# Patient Record
Sex: Female | Born: 1955 | Race: White | Hispanic: No | Marital: Married | Smoking: Former smoker
Health system: Southern US, Community
[De-identification: ages and names within clinical notes are randomized; demographics above are authoritative.]

## PROBLEM LIST (undated history)

## (undated) DIAGNOSIS — F419 Anxiety disorder, unspecified: Secondary | ICD-10-CM

## (undated) DIAGNOSIS — T7840XA Allergy, unspecified, initial encounter: Secondary | ICD-10-CM

## (undated) DIAGNOSIS — E785 Hyperlipidemia, unspecified: Secondary | ICD-10-CM

## (undated) DIAGNOSIS — K219 Gastro-esophageal reflux disease without esophagitis: Secondary | ICD-10-CM

## (undated) DIAGNOSIS — I341 Nonrheumatic mitral (valve) prolapse: Secondary | ICD-10-CM

## (undated) DIAGNOSIS — I10 Essential (primary) hypertension: Secondary | ICD-10-CM

## (undated) DIAGNOSIS — E119 Type 2 diabetes mellitus without complications: Secondary | ICD-10-CM

## (undated) HISTORY — DX: Nonrheumatic mitral (valve) prolapse: I34.1

## (undated) HISTORY — DX: Essential (primary) hypertension: I10

## (undated) HISTORY — DX: Gastro-esophageal reflux disease without esophagitis: K21.9

## (undated) HISTORY — DX: Allergy, unspecified, initial encounter: T78.40XA

## (undated) HISTORY — DX: Anxiety disorder, unspecified: F41.9

## (undated) HISTORY — DX: Hyperlipidemia, unspecified: E78.5

## (undated) HISTORY — DX: Type 2 diabetes mellitus without complications: E11.9

---
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
## 1982-11-09 HISTORY — PX: CHOLECYSTECTOMY: SHX55

## 1988-11-09 DIAGNOSIS — I10 Essential (primary) hypertension: Secondary | ICD-10-CM

## 1988-11-09 HISTORY — DX: Essential (primary) hypertension: I10

## 1990-11-09 HISTORY — PX: OTHER SURGICAL HISTORY: SHX169

## 1999-11-10 HISTORY — PX: OTHER SURGICAL HISTORY: SHX169

## 2000-06-30 ENCOUNTER — Other Ambulatory Visit: Admission: RE | Admit: 2000-06-30 | Discharge: 2000-06-30 | Payer: Self-pay | Admitting: Emergency Medicine

## 2001-04-12 ENCOUNTER — Encounter: Payer: Self-pay | Admitting: Emergency Medicine

## 2001-04-12 ENCOUNTER — Encounter: Admission: RE | Admit: 2001-04-12 | Discharge: 2001-04-12 | Payer: Self-pay | Admitting: Emergency Medicine

## 2001-05-09 ENCOUNTER — Encounter: Admission: RE | Admit: 2001-05-09 | Discharge: 2001-05-09 | Payer: Self-pay | Admitting: Emergency Medicine

## 2001-05-09 ENCOUNTER — Encounter: Payer: Self-pay | Admitting: Emergency Medicine

## 2001-08-11 ENCOUNTER — Encounter: Admission: RE | Admit: 2001-08-11 | Discharge: 2001-08-11 | Payer: Self-pay | Admitting: Emergency Medicine

## 2001-08-11 ENCOUNTER — Encounter: Payer: Self-pay | Admitting: Emergency Medicine

## 2001-11-09 HISTORY — PX: INCISIONAL HERNIA REPAIR: SHX193

## 2004-01-30 ENCOUNTER — Ambulatory Visit (HOSPITAL_COMMUNITY): Admission: RE | Admit: 2004-01-30 | Discharge: 2004-01-30 | Payer: Self-pay | Admitting: Emergency Medicine

## 2004-01-30 ENCOUNTER — Encounter: Admission: RE | Admit: 2004-01-30 | Discharge: 2004-01-30 | Payer: Self-pay | Admitting: Emergency Medicine

## 2005-03-03 ENCOUNTER — Encounter: Admission: RE | Admit: 2005-03-03 | Discharge: 2005-03-03 | Payer: Self-pay | Admitting: Emergency Medicine

## 2007-01-27 ENCOUNTER — Encounter: Admission: RE | Admit: 2007-01-27 | Discharge: 2007-01-27 | Payer: Self-pay | Admitting: Emergency Medicine

## 2007-03-11 ENCOUNTER — Encounter: Admission: RE | Admit: 2007-03-11 | Discharge: 2007-03-11 | Payer: Self-pay | Admitting: Emergency Medicine

## 2008-02-20 ENCOUNTER — Encounter: Admission: RE | Admit: 2008-02-20 | Discharge: 2008-05-20 | Payer: Self-pay | Admitting: Emergency Medicine

## 2008-04-04 ENCOUNTER — Encounter: Admission: RE | Admit: 2008-04-04 | Discharge: 2008-04-04 | Payer: Self-pay | Admitting: Emergency Medicine

## 2011-03-09 ENCOUNTER — Ambulatory Visit
Admission: RE | Admit: 2011-03-09 | Discharge: 2011-03-09 | Disposition: A | Discharge status: Home / Self Care | Payer: PRIVATE HEALTH INSURANCE | Source: Ambulatory Visit | Attending: Emergency Medicine | Admitting: Emergency Medicine

## 2011-03-09 ENCOUNTER — Other Ambulatory Visit: Payer: Self-pay | Admitting: Emergency Medicine

## 2011-03-09 DIAGNOSIS — M25561 Pain in right knee: Secondary | ICD-10-CM

## 2011-06-19 ENCOUNTER — Other Ambulatory Visit: Payer: Self-pay | Admitting: Emergency Medicine

## 2011-06-19 DIAGNOSIS — Z1231 Encounter for screening mammogram for malignant neoplasm of breast: Secondary | ICD-10-CM

## 2011-07-23 ENCOUNTER — Ambulatory Visit
Admission: RE | Admit: 2011-07-23 | Discharge: 2011-07-23 | Disposition: A | Discharge status: Home / Self Care | Payer: PRIVATE HEALTH INSURANCE | Source: Ambulatory Visit | Attending: Emergency Medicine | Admitting: Emergency Medicine

## 2011-07-23 DIAGNOSIS — Z1231 Encounter for screening mammogram for malignant neoplasm of breast: Secondary | ICD-10-CM

## 2011-11-10 DIAGNOSIS — E785 Hyperlipidemia, unspecified: Secondary | ICD-10-CM

## 2011-11-10 HISTORY — DX: Hyperlipidemia, unspecified: E78.5

## 2013-08-18 ENCOUNTER — Other Ambulatory Visit: Payer: Self-pay | Admitting: Family Medicine

## 2013-08-18 DIAGNOSIS — Z1231 Encounter for screening mammogram for malignant neoplasm of breast: Secondary | ICD-10-CM

## 2013-08-18 DIAGNOSIS — E2839 Other primary ovarian failure: Secondary | ICD-10-CM

## 2013-09-05 ENCOUNTER — Encounter: Payer: Self-pay | Admitting: Internal Medicine

## 2013-09-19 ENCOUNTER — Ambulatory Visit: Payer: PRIVATE HEALTH INSURANCE | Admitting: Internal Medicine

## 2013-11-09 DIAGNOSIS — F419 Anxiety disorder, unspecified: Secondary | ICD-10-CM

## 2013-11-09 DIAGNOSIS — K219 Gastro-esophageal reflux disease without esophagitis: Secondary | ICD-10-CM

## 2013-11-09 HISTORY — DX: Anxiety disorder, unspecified: F41.9

## 2013-11-09 HISTORY — DX: Gastro-esophageal reflux disease without esophagitis: K21.9

## 2013-11-16 ENCOUNTER — Encounter: Payer: PRIVATE HEALTH INSURANCE | Admitting: Internal Medicine

## 2013-11-20 ENCOUNTER — Ambulatory Visit
Admission: RE | Admit: 2013-11-20 | Discharge: 2013-11-20 | Disposition: A | Discharge status: Home / Self Care | Payer: PRIVATE HEALTH INSURANCE | Source: Ambulatory Visit | Attending: Family Medicine | Admitting: Family Medicine

## 2013-11-20 DIAGNOSIS — E2839 Other primary ovarian failure: Secondary | ICD-10-CM

## 2013-11-20 DIAGNOSIS — Z1231 Encounter for screening mammogram for malignant neoplasm of breast: Secondary | ICD-10-CM

## 2013-11-23 ENCOUNTER — Encounter: Payer: Self-pay | Admitting: Family Medicine

## 2015-01-21 IMAGING — MG MM SCREEN MAMMOGRAM BILATERAL
4 series · 4 of 4 positions shown · non-contrast
Comparison: 07/23/2011, 01/27/2007

CLINICAL DATA: Screening.

EXAM:
DIGITAL SCREENING BILATERAL MAMMOGRAM WITH CAD

[R CC]
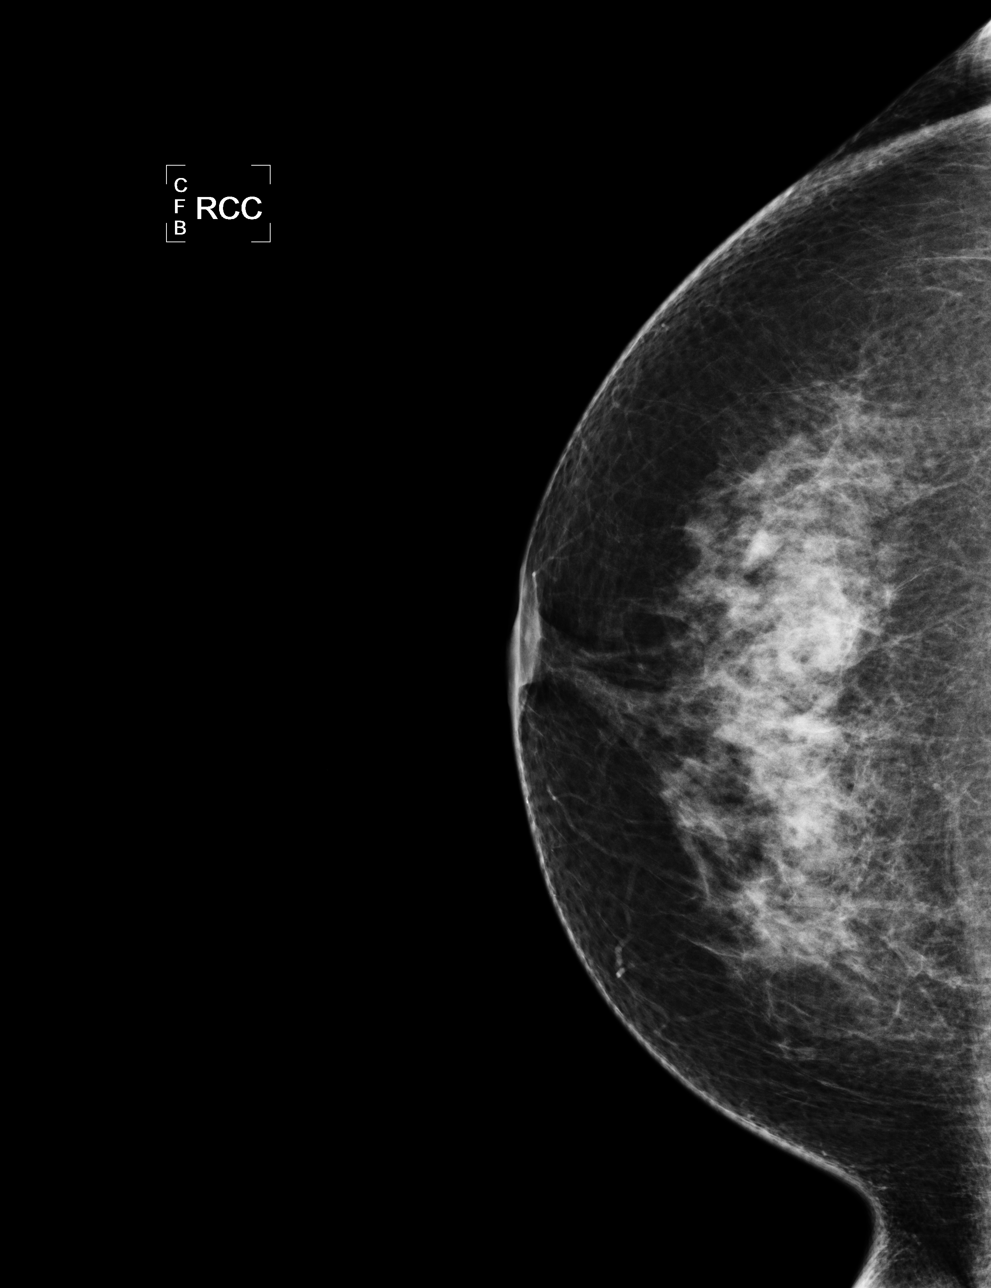

[L CC]
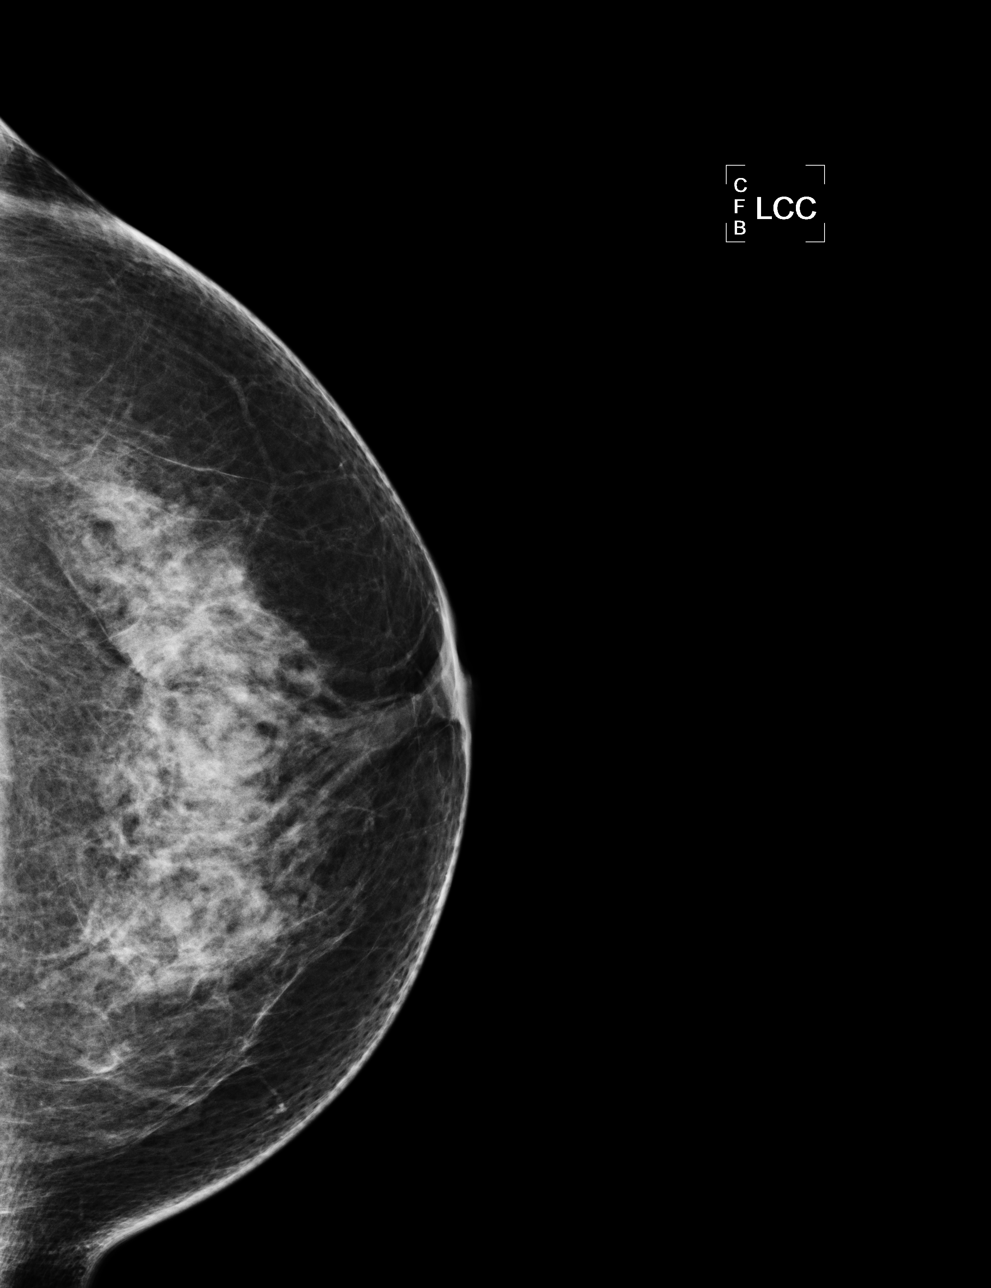

[L MLO]
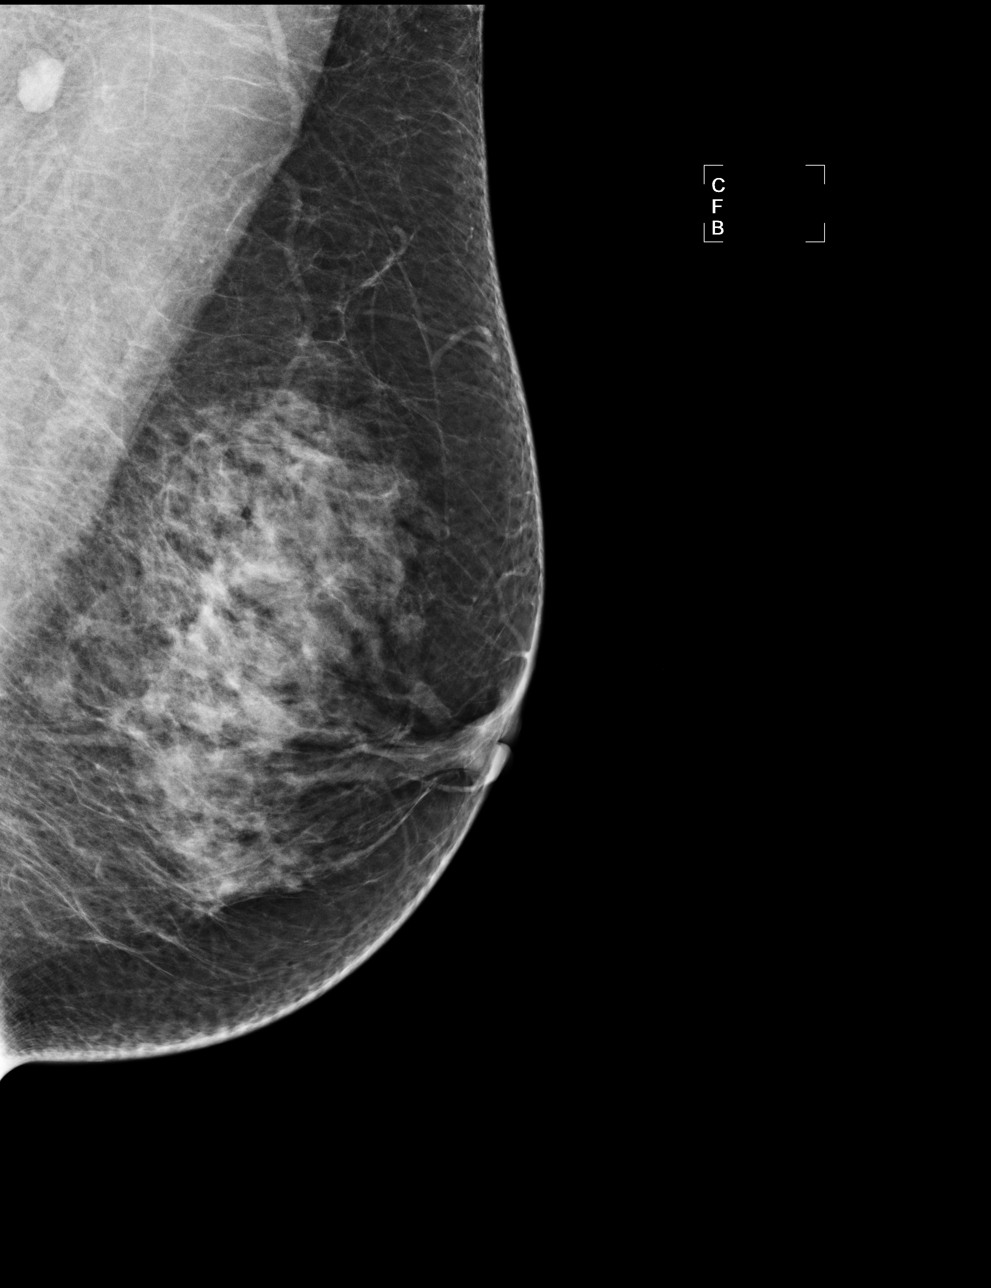

[R MLO]
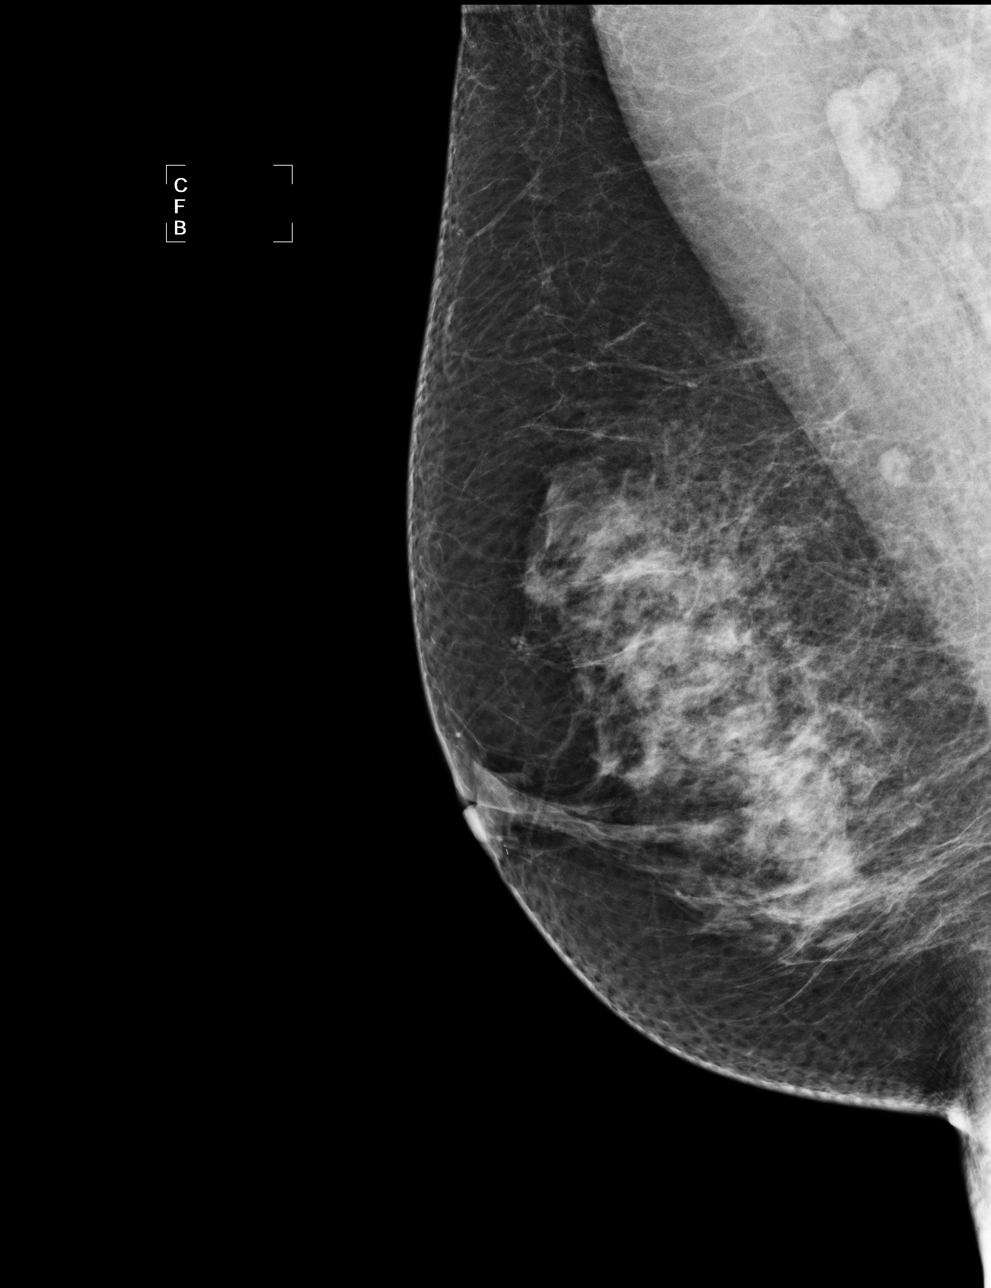

[4 of 4 positions shown; findings below may reference images not displayed]

ACR Breast Density Category c: The breast tissue is heterogeneously
dense, which may obscure small masses.
FINDINGS: There are no findings suspicious for malignancy. Images were
processed with CAD.
IMPRESSION: No mammographic evidence of malignancy. A result letter of this
screening mammogram will be mailed directly to the patient.

RECOMMENDATION:
Screening mammogram in one year. (Code:3R-3-UMG)

BI-RADS CATEGORY  1: Negative

## 2015-03-05 ENCOUNTER — Other Ambulatory Visit: Payer: Self-pay | Admitting: Family Medicine

## 2015-03-05 ENCOUNTER — Ambulatory Visit
Admission: RE | Admit: 2015-03-05 | Discharge: 2015-03-05 | Disposition: A | Discharge status: Home / Self Care | Payer: 59 | Source: Ambulatory Visit | Attending: Family Medicine | Admitting: Family Medicine

## 2015-03-05 DIAGNOSIS — M545 Low back pain: Secondary | ICD-10-CM

## 2015-03-06 ENCOUNTER — Other Ambulatory Visit: Payer: Self-pay | Admitting: Family Medicine

## 2015-03-06 DIAGNOSIS — R109 Unspecified abdominal pain: Secondary | ICD-10-CM

## 2015-03-08 ENCOUNTER — Ambulatory Visit
Admission: RE | Admit: 2015-03-08 | Discharge: 2015-03-08 | Disposition: A | Discharge status: Home / Self Care | Payer: 59 | Source: Ambulatory Visit | Attending: Family Medicine | Admitting: Family Medicine

## 2015-03-08 DIAGNOSIS — R109 Unspecified abdominal pain: Secondary | ICD-10-CM

## 2016-05-08 IMAGING — US US RENAL
1 series · 14 of 25 positions shown · non-contrast
Comparison: Abdominal CT 03/11/2007

CLINICAL DATA: Left flank pain

EXAM:
RENAL / URINARY TRACT ULTRASOUND COMPLETE

[Series 1: us renal · 0.30mm/px · 14 of 45 slices shown]
[im 1/45]
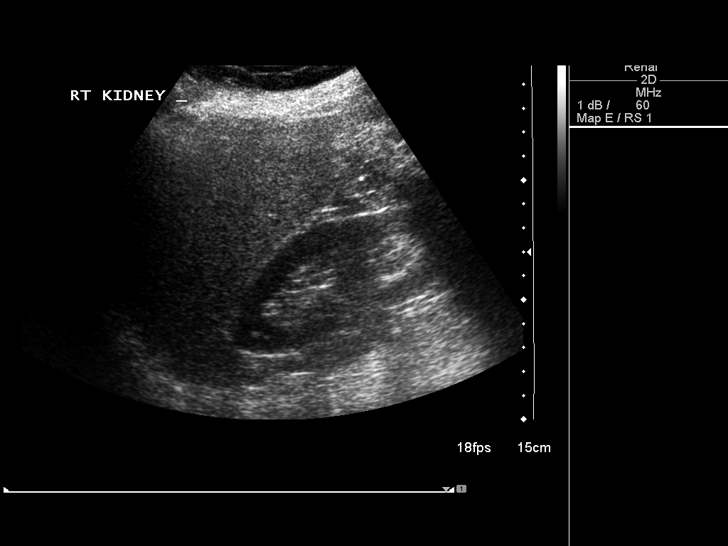
[im 4/45]
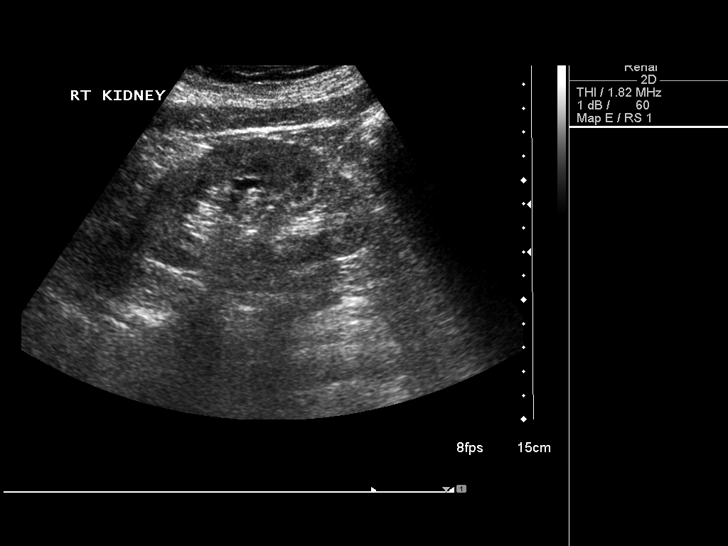
[im 8/45]
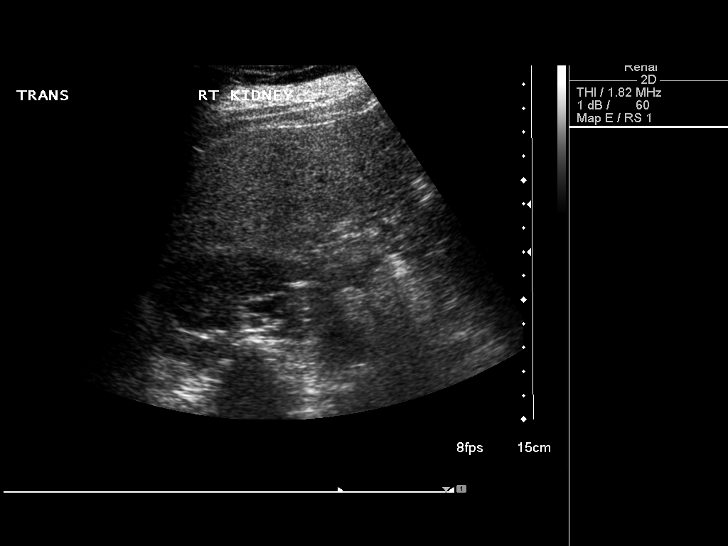
[im 12/45]
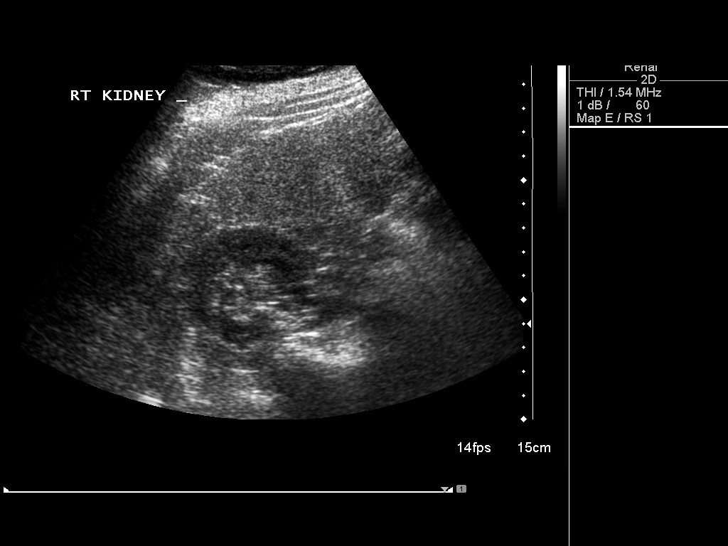
[im 15/45]
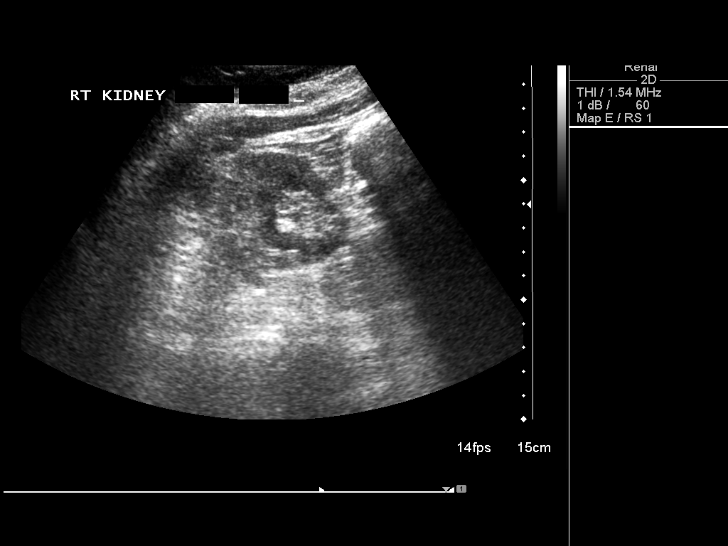
[im 17/45]
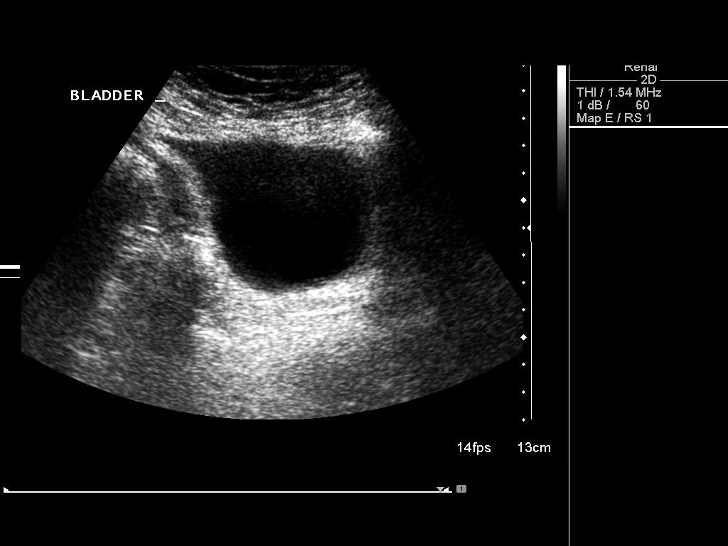
[im 21/45]
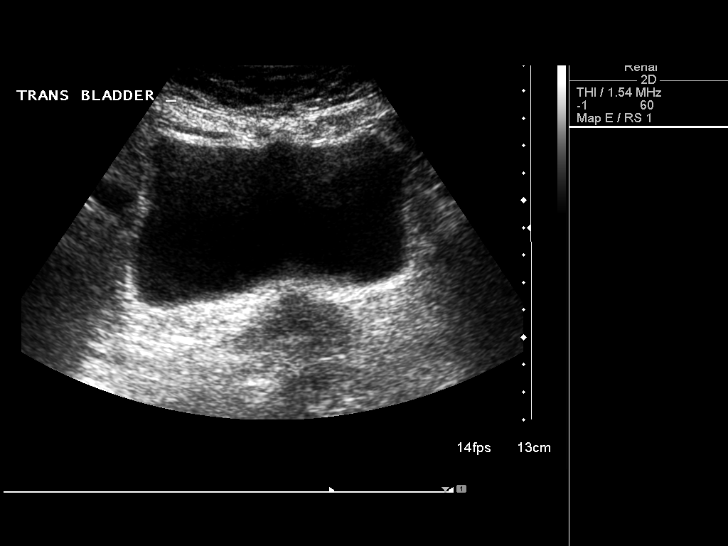
[im 24/45]
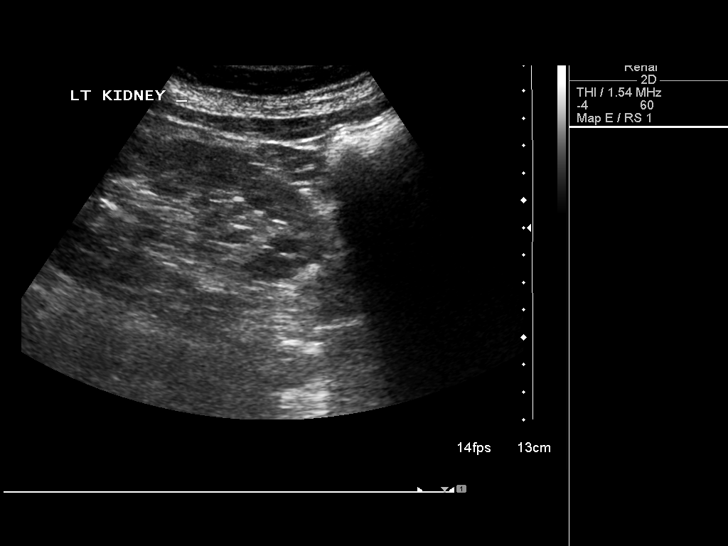
[im 28/45]
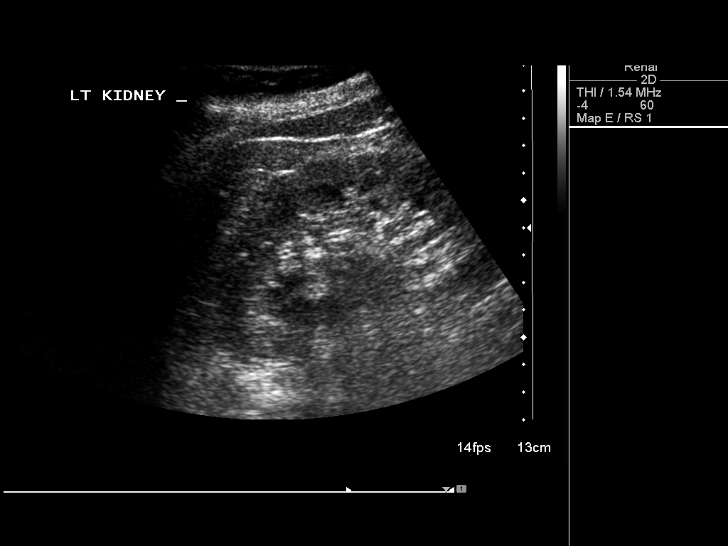
[im 30/45]
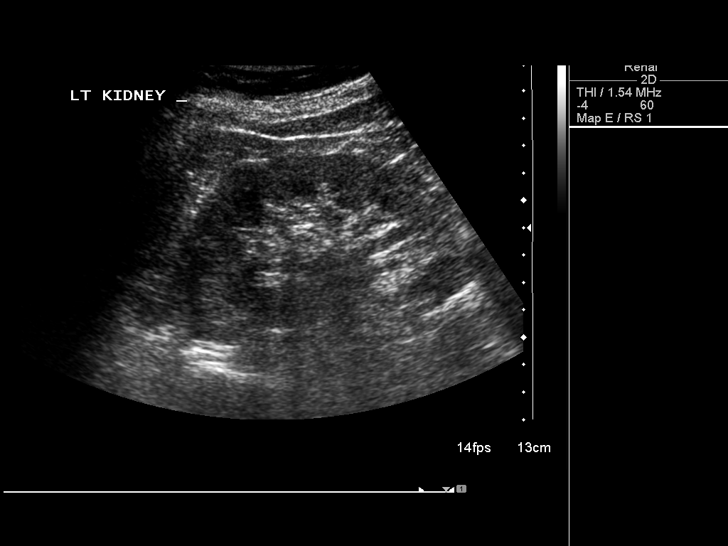
[im 34/45]
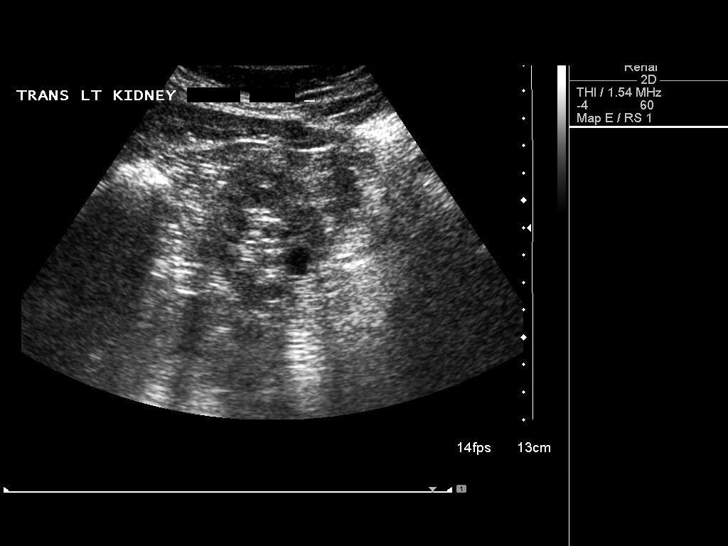
[im 37/45]
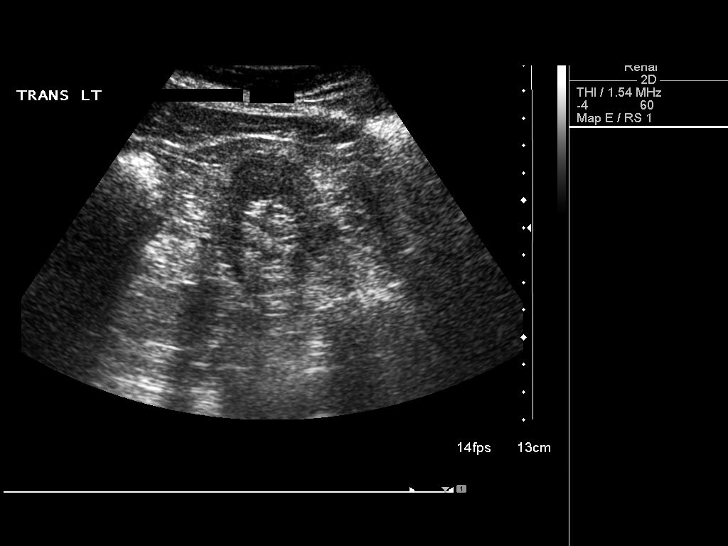
[im 41/45]
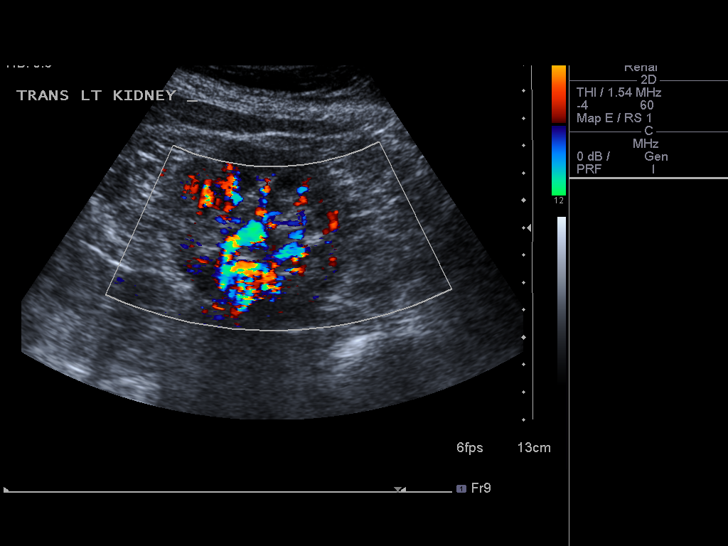
[im 45/45]
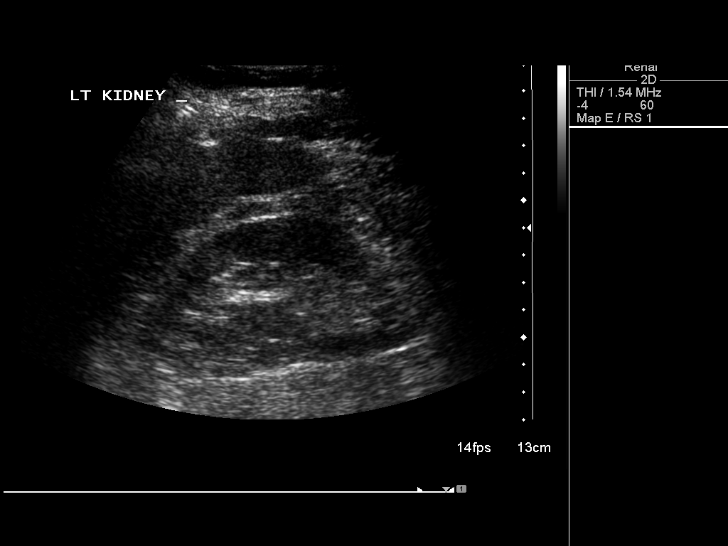

[14 of 25 positions shown; findings below may reference images not displayed]

FINDINGS: Right Kidney:

Length: 11 cm. Echogenicity within normal limits. No mass or
hydronephrosis visualized.

Left Kidney:

Length: 11 cm. Echogenicity within normal limits. No solid mass or
hydronephrosis visualized. Incidental 11 mm cyst in the lower
cortex, also seen in 1887.

Bladder:

Appears normal for degree of bladder distention.
IMPRESSION: No hydronephrosis or other explanation for flank pain.

## 2016-11-04 ENCOUNTER — Other Ambulatory Visit: Payer: Self-pay | Admitting: Family Medicine

## 2016-11-04 DIAGNOSIS — M8580 Other specified disorders of bone density and structure, unspecified site: Secondary | ICD-10-CM

## 2016-11-04 DIAGNOSIS — Z780 Asymptomatic menopausal state: Secondary | ICD-10-CM

## 2016-11-11 ENCOUNTER — Encounter: Payer: Self-pay | Admitting: Internal Medicine

## 2016-12-01 ENCOUNTER — Ambulatory Visit
Admission: RE | Admit: 2016-12-01 | Discharge: 2016-12-01 | Disposition: A | Discharge status: Home / Self Care | Payer: BLUE CROSS/BLUE SHIELD | Source: Ambulatory Visit | Attending: Family Medicine | Admitting: Family Medicine

## 2016-12-01 DIAGNOSIS — M8580 Other specified disorders of bone density and structure, unspecified site: Secondary | ICD-10-CM

## 2016-12-01 DIAGNOSIS — Z780 Asymptomatic menopausal state: Secondary | ICD-10-CM

## 2016-12-30 ENCOUNTER — Ambulatory Visit: Payer: BLUE CROSS/BLUE SHIELD

## 2016-12-30 VITALS — Ht 63.0 in | Wt 171.0 lb

## 2016-12-30 DIAGNOSIS — Z1211 Encounter for screening for malignant neoplasm of colon: Secondary | ICD-10-CM

## 2016-12-30 MED ORDER — NA SULFATE-K SULFATE-MG SULF 17.5-3.13-1.6 GM/177ML PO SOLN: 1.0000 | Freq: Once | ORAL | 0 refills | Status: AC

## 2017-01-01 ENCOUNTER — Encounter: Payer: Self-pay | Admitting: Internal Medicine

## 2017-01-13 ENCOUNTER — Encounter: Payer: Self-pay | Admitting: Internal Medicine

## 2017-01-15 ENCOUNTER — Ambulatory Visit (AMBULATORY_SURGERY_CENTER): Payer: BLUE CROSS/BLUE SHIELD | Admitting: Internal Medicine

## 2017-01-15 ENCOUNTER — Encounter: Payer: Self-pay | Admitting: Internal Medicine

## 2017-01-15 VITALS — BP 135/71 | HR 63 | Temp 99.6°F | Resp 13 | Ht 63.0 in | Wt 171.0 lb

## 2017-01-15 DIAGNOSIS — K635 Polyp of colon: Secondary | ICD-10-CM

## 2017-01-15 DIAGNOSIS — Z1211 Encounter for screening for malignant neoplasm of colon: Secondary | ICD-10-CM | POA: Diagnosis present

## 2017-01-15 DIAGNOSIS — D125 Benign neoplasm of sigmoid colon: Secondary | ICD-10-CM

## 2017-01-15 DIAGNOSIS — D123 Benign neoplasm of transverse colon: Secondary | ICD-10-CM

## 2017-01-15 DIAGNOSIS — Z1212 Encounter for screening for malignant neoplasm of rectum: Secondary | ICD-10-CM | POA: Diagnosis not present

## 2017-01-15 MED ORDER — SODIUM CHLORIDE 0.9 % IV SOLN: 500.0000 mL | INTRAVENOUS | Status: DC

## 2017-01-15 NOTE — Progress Notes (Signed)
Called to room to assist during endoscopic procedure.  Patient ID and intended procedure confirmed with present staff. Received instructions for my participation in the procedure from the performing physician.  

## 2017-01-15 NOTE — Progress Notes (Signed)
To PACU, vss patent aw report to rn 

## 2017-01-15 NOTE — Patient Instructions (Signed)
YOU HAD AN ENDOSCOPIC PROCEDURE TODAY AT [facility]:   Refer to the procedure report that was given to you for any specific questions about what was found during the examination.  If the procedure report does not answer your questions, please call your gastroenterologist to clarify.  If you requested that your care partner not be given the details of your procedure findings, then the procedure report has been included in a sealed envelope for you to review at your convenience later.  YOU SHOULD EXPECT: Some feelings of bloating in the abdomen. Passage of more gas than usual.  Walking can help get rid of the air that was put into your GI tract during the procedure and reduce the bloating. If you had a lower endoscopy (such as a colonoscopy or flexible sigmoidoscopy) you may notice spotting of blood in your stool or on the toilet paper. If you underwent a bowel prep for your procedure, you may not have a normal bowel movement for a few days.  Please Note:  You might notice some irritation and congestion in your nose or some drainage.  This is from the oxygen used during your procedure.  There is no need for concern and it should clear up in a day or so.  SYMPTOMS TO REPORT IMMEDIATELY:   Following lower endoscopy (colonoscopy or flexible sigmoidoscopy):  Excessive amounts of blood in the stool  Significant tenderness or worsening of abdominal pains  Swelling of the abdomen that is new, acute  Fever of 100F or higher   For urgent or emergent issues, a gastroenterologist can be reached at any hour by calling [phone].   DIET:  We do recommend a small meal at first, but then you may proceed to your regular diet.  Drink plenty of fluids but you should avoid alcoholic beverages for 24 hours.  ACTIVITY:  You should plan to take it easy for the rest of today and you should NOT DRIVE or use heavy machinery until tomorrow (because of the sedation medicines used during the test).     FOLLOW UP: Our staff will call the number listed on your records the next business day following your procedure to check on you and address any questions or concerns that you may have regarding the information given to you following your procedure. If we do not reach you, we will leave a message.  However, if you are feeling well and you are not experiencing any problems, there is no need to return our call.  We will assume that you have returned to your regular daily activities without incident.  If any biopsies were taken you will be contacted by phone or by letter within the next 1-3 weeks.  Please call us at [phone] if you have not heard about the biopsies in 3 weeks.   Await for biopsy results to determined next repeat Colonoscopy screening in probably 3- 5 years Diverticulosis (handout given)   SIGNATURES/CONFIDENTIALITY: You and/or your care partner have signed paperwork which will be entered into your electronic medical record.  These signatures attest to the fact that that the information above on your After Visit Summary has been reviewed and is understood.  Full responsibility of the confidentiality of this discharge information lies with you and/or your care-partner.

## 2017-01-15 NOTE — Op Note (Signed)
[facility] Patient Name: Stacy Parrish Procedure Date: 01/15/2017 11:15 AM MRN: [id number] Endoscopist: Docia Chuck. Henrene Pastor , MD Age: 61 Referring MD:  Date of Birth: May 18, 1956 Gender: Female Account #: [id number] Procedure:                Colonoscopy, with cold snare polypectomy x 4 Indications:              Screening for colorectal malignant neoplasm Medicines:                Monitored Anesthesia Care Procedure:                Pre-Anesthesia Assessment:                           - Prior to the procedure, a History and Physical                            was performed, and patient medications and                            allergies were reviewed. The patient's tolerance of                            previous anesthesia was also reviewed. The risks                            and benefits of the procedure and the sedation                            options and risks were discussed with the patient.                            All questions were answered, and informed consent                            was obtained. Prior Anticoagulants: The patient has                            taken no previous anticoagulant or antiplatelet                            agents. ASA Grade Assessment: II - A patient with                            mild systemic disease. After reviewing the risks                            and benefits, the patient was deemed in                            satisfactory condition to undergo the procedure.                           After obtaining informed consent, the colonoscope  was passed under direct vision. Throughout the                            procedure, the patient's blood pressure, pulse, and                            oxygen saturations were monitored continuously. The                            Colonoscope was introduced through the anus and                            advanced to the the cecum, identified by         appendiceal orifice and ileocecal valve. The                            ileocecal valve, appendiceal orifice, and rectum                            were photographed. The quality of the bowel                            preparation was excellent. The colonoscopy was                            performed without difficulty. The patient tolerated                            the procedure well. The bowel preparation used was                            SUPREP. Scope In: 11:21:28 AM Scope Out: 11:41:57 AM Scope Withdrawal Time: 0 hours 15 minutes 33 seconds  Total Procedure Duration: 0 hours 20 minutes 29 seconds  Findings:                 Four polyps were found in the sigmoid colon and                            transverse colon. The polyps were 3 to 6 mm in                            size. These polyps were removed with a cold snare.                            Resection and retrieval were complete.                           Multiple diverticula were found in the sigmoid                            colon.                           The exam  was otherwise without abnormality on                            direct and retroflexion views. Complications:            No immediate complications. Estimated blood loss:                            None. Estimated Blood Loss:     Estimated blood loss: none. Impression:               - Four 3 to 6 mm polyps in the sigmoid colon and in                            the transverse colon, removed with a cold snare.                            Resected and retrieved.                           - Diverticulosis in the sigmoid colon.                           - The examination was otherwise normal on direct                            and retroflexion views. Recommendation:           - Repeat colonoscopy in 3 - 5 years for                            surveillance. final recndation based on pathology                            results.                           -  Patient has a contact number available for                            emergencies. The signs and symptoms of potential                            delayed complications were discussed with the                            patient. Return to normal activities tomorrow.                            Written discharge instructions were provided to the                            patient.                           - Resume previous diet.                           -  Continue present medications.                           - Await pathology results. Docia Chuck. Henrene Pastor, MD 01/15/2017 11:48:35 AM This report has been signed electronically.

## 2017-01-19 ENCOUNTER — Encounter: Payer: Self-pay | Admitting: Internal Medicine

## 2018-01-07 ENCOUNTER — Other Ambulatory Visit: Payer: Self-pay | Admitting: Family Medicine

## 2018-01-07 DIAGNOSIS — Z1231 Encounter for screening mammogram for malignant neoplasm of breast: Secondary | ICD-10-CM

## 2018-01-26 ENCOUNTER — Ambulatory Visit
Admission: RE | Admit: 2018-01-26 | Discharge: 2018-01-26 | Disposition: A | Discharge status: Home / Self Care | Payer: BLUE CROSS/BLUE SHIELD | Source: Ambulatory Visit | Attending: Family Medicine | Admitting: Family Medicine

## 2018-01-26 DIAGNOSIS — Z1231 Encounter for screening mammogram for malignant neoplasm of breast: Secondary | ICD-10-CM

## 2019-03-29 IMAGING — MG DIGITAL SCREENING BILATERAL MAMMOGRAM WITH CAD
4 series · 4 of 4 positions shown · non-contrast
Comparison: Previous exam(s).

CLINICAL DATA: Screening.

EXAM:
DIGITAL SCREENING BILATERAL MAMMOGRAM WITH CAD

[R CC]
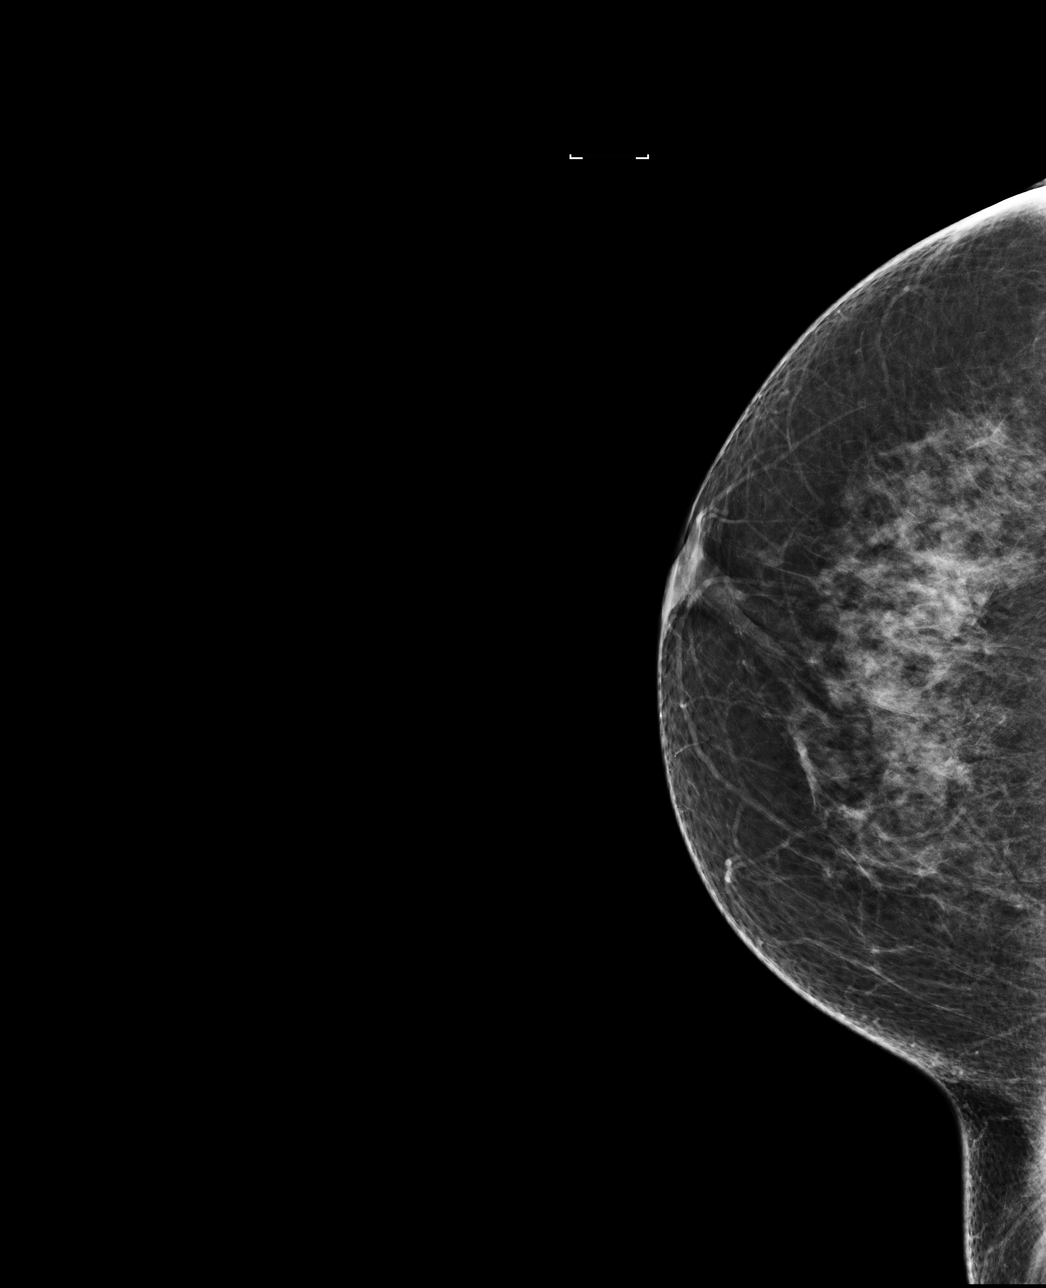

[R MLO]
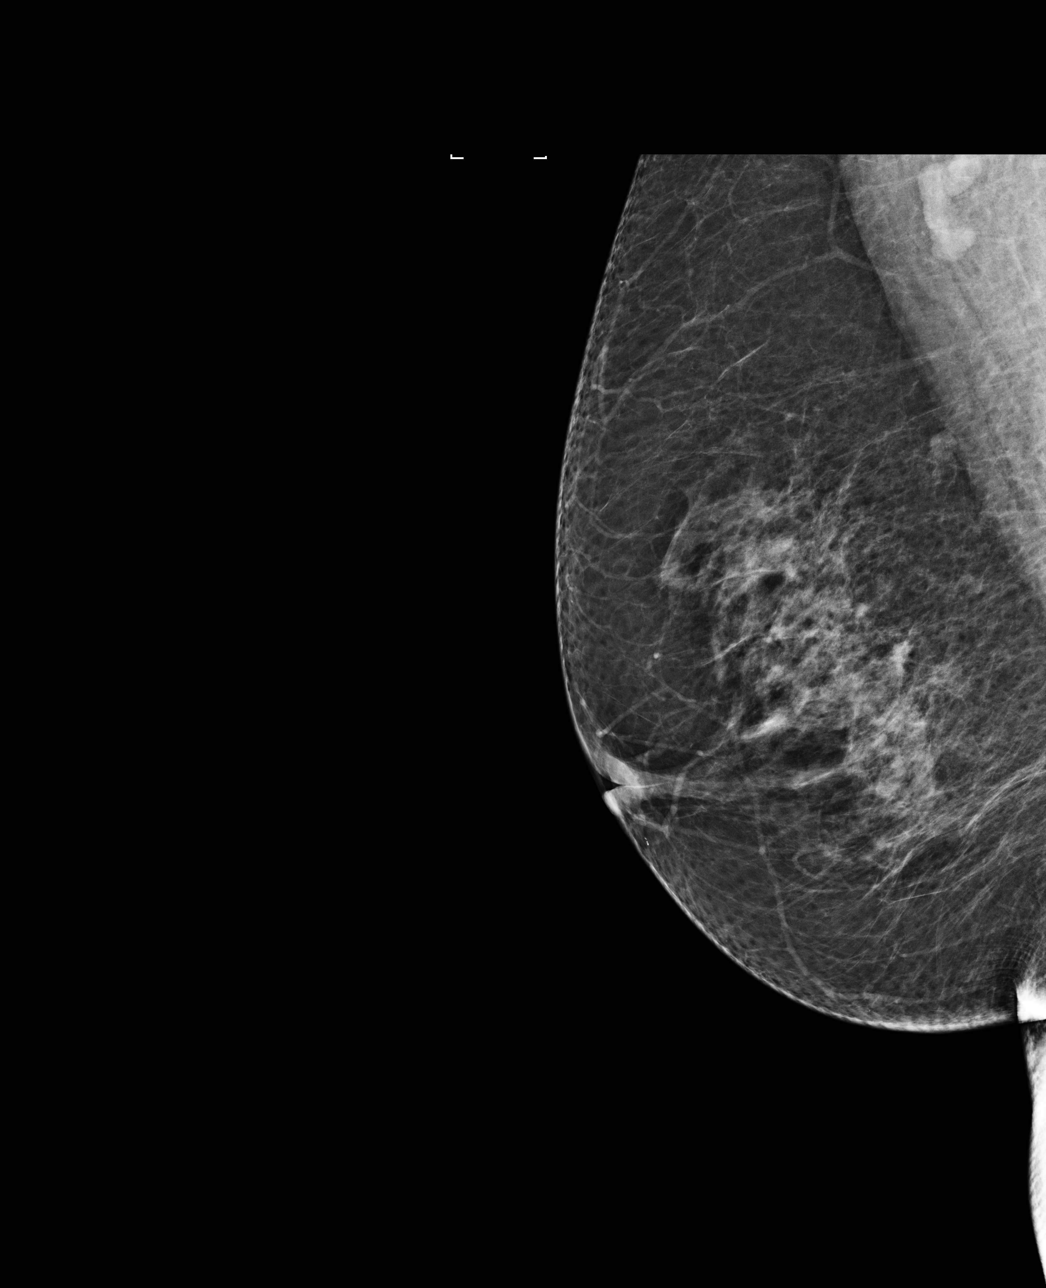

[L CC]
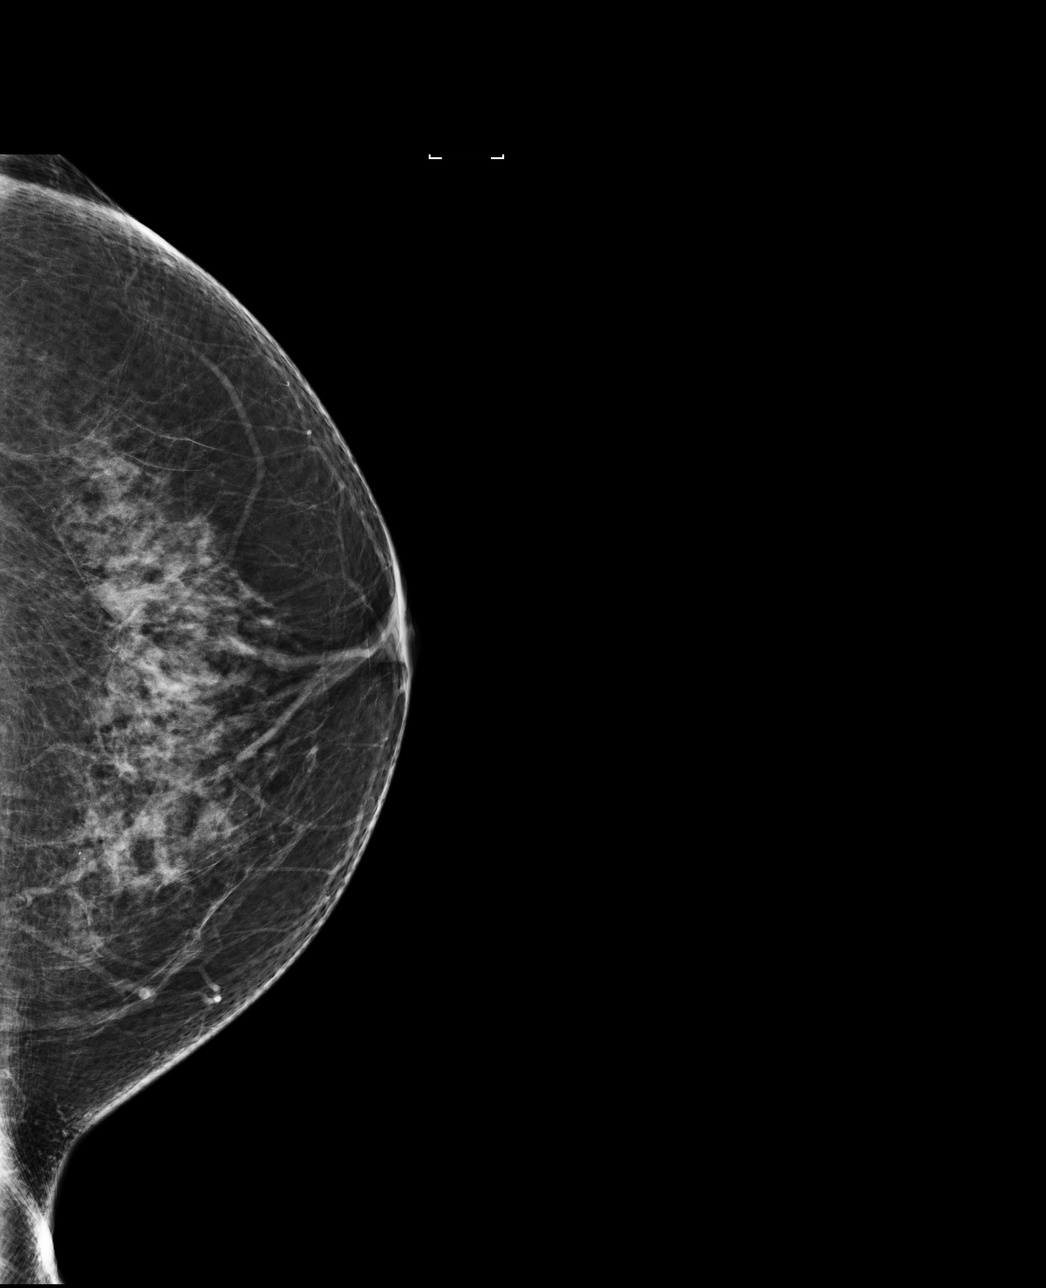

[L MLO]
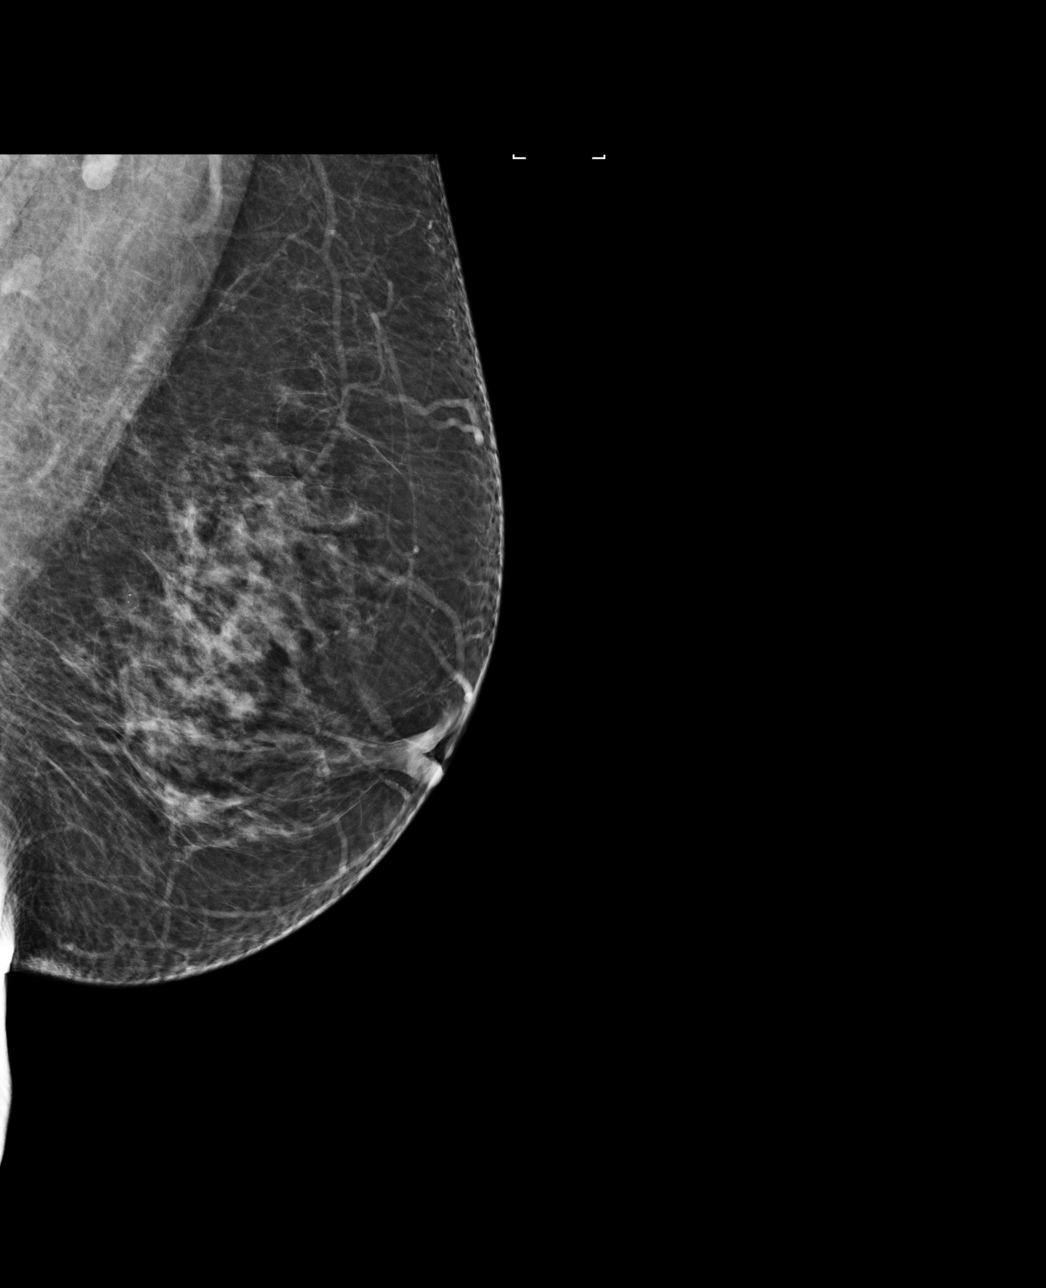

[4 of 4 positions shown; findings below may reference images not displayed]

ACR Breast Density Category c: The breast tissue is heterogeneously
dense, which may obscure small masses.
FINDINGS: There are no findings suspicious for malignancy. Images were
processed with CAD.
IMPRESSION: No mammographic evidence of malignancy. A result letter of this
screening mammogram will be mailed directly to the patient.

RECOMMENDATION:
Screening mammogram in one year. (Code:[id number])

BI-RADS CATEGORY  1: Negative.

## 2020-08-13 ENCOUNTER — Other Ambulatory Visit: Payer: Self-pay | Admitting: Family Medicine

## 2020-08-13 DIAGNOSIS — Z1231 Encounter for screening mammogram for malignant neoplasm of breast: Secondary | ICD-10-CM

## 2020-09-23 ENCOUNTER — Other Ambulatory Visit: Payer: Self-pay

## 2020-09-23 ENCOUNTER — Ambulatory Visit
Admission: RE | Admit: 2020-09-23 | Discharge: 2020-09-23 | Disposition: A | Discharge status: Home / Self Care | Payer: BC Managed Care – PPO | Source: Ambulatory Visit | Attending: Family Medicine | Admitting: Family Medicine

## 2020-09-23 DIAGNOSIS — Z1231 Encounter for screening mammogram for malignant neoplasm of breast: Secondary | ICD-10-CM

## 2020-11-04 ENCOUNTER — Ambulatory Visit
Admission: RE | Admit: 2020-11-04 | Discharge: 2020-11-04 | Disposition: A | Discharge status: Home / Self Care | Payer: BC Managed Care – PPO | Source: Ambulatory Visit | Attending: Family Medicine | Admitting: Family Medicine

## 2020-11-04 ENCOUNTER — Other Ambulatory Visit: Payer: Self-pay

## 2021-07-31 ENCOUNTER — Other Ambulatory Visit: Payer: Self-pay | Admitting: Family Medicine

## 2021-07-31 DIAGNOSIS — M8580 Other specified disorders of bone density and structure, unspecified site: Secondary | ICD-10-CM

## 2021-07-31 DIAGNOSIS — Z1231 Encounter for screening mammogram for malignant neoplasm of breast: Secondary | ICD-10-CM

## 2021-11-17 ENCOUNTER — Ambulatory Visit
Admission: RE | Admit: 2021-11-17 | Discharge: 2021-11-17 | Disposition: A | Discharge status: Home / Self Care | Payer: Medicare HMO | Source: Ambulatory Visit | Attending: Family Medicine | Admitting: Family Medicine

## 2021-11-17 DIAGNOSIS — Z1231 Encounter for screening mammogram for malignant neoplasm of breast: Secondary | ICD-10-CM

## 2022-01-13 ENCOUNTER — Ambulatory Visit
Admission: RE | Admit: 2022-01-13 | Discharge: 2022-01-13 | Disposition: A | Discharge status: Home / Self Care | Payer: Medicare HMO | Source: Ambulatory Visit | Attending: Family Medicine | Admitting: Family Medicine

## 2022-01-13 DIAGNOSIS — M8580 Other specified disorders of bone density and structure, unspecified site: Secondary | ICD-10-CM

## 2022-01-19 ENCOUNTER — Other Ambulatory Visit: Payer: Self-pay | Admitting: Family Medicine

## 2022-07-06 ENCOUNTER — Encounter: Payer: Self-pay | Admitting: Internal Medicine

## 2022-07-20 ENCOUNTER — Ambulatory Visit (AMBULATORY_SURGERY_CENTER): Payer: Medicare HMO

## 2022-07-20 ENCOUNTER — Other Ambulatory Visit: Payer: Self-pay

## 2022-07-20 VITALS — Ht 63.0 in | Wt 150.6 lb

## 2022-07-20 DIAGNOSIS — Z86010 Personal history of colonic polyps: Secondary | ICD-10-CM

## 2022-07-20 MED ORDER — NA SULFATE-K SULFATE-MG SULF 17.5-3.13-1.6 GM/177ML PO SOLN: 1.0000 | Freq: Once | ORAL | 0 refills | Status: AC

## 2022-07-20 NOTE — Progress Notes (Signed)
Denies allergies to eggs or soy products. Denies complication of anesthesia or sedation. Denies use of weight loss medication. Denies use of O2.   Emmi instructions given for colonoscopy.  

## 2022-07-31 ENCOUNTER — Encounter: Payer: Self-pay | Admitting: Internal Medicine

## 2022-08-10 ENCOUNTER — Encounter: Payer: Medicare HMO | Admitting: Internal Medicine

## 2022-08-17 ENCOUNTER — Ambulatory Visit (AMBULATORY_SURGERY_CENTER): Payer: Medicare HMO | Admitting: Internal Medicine

## 2022-08-17 ENCOUNTER — Encounter: Payer: Self-pay | Admitting: Internal Medicine

## 2022-08-17 VITALS — BP 195/93 | HR 57 | Temp 98.2°F | Resp 12 | Ht 63.0 in | Wt 150.6 lb

## 2022-08-17 DIAGNOSIS — D123 Benign neoplasm of transverse colon: Secondary | ICD-10-CM | POA: Diagnosis not present

## 2022-08-17 DIAGNOSIS — Z09 Encounter for follow-up examination after completed treatment for conditions other than malignant neoplasm: Secondary | ICD-10-CM | POA: Diagnosis present

## 2022-08-17 DIAGNOSIS — Z86010 Personal history of colonic polyps: Secondary | ICD-10-CM | POA: Diagnosis not present

## 2022-08-17 DIAGNOSIS — D122 Benign neoplasm of ascending colon: Secondary | ICD-10-CM

## 2022-08-17 MED ORDER — SODIUM CHLORIDE 0.9 % IV SOLN: 500.0000 mL | Freq: Once | INTRAVENOUS | Status: DC

## 2022-08-17 NOTE — Progress Notes (Signed)
HISTORY OF PRESENT ILLNESS:  Stacy Parrish is a 66 y.o. female with a history of multiple adenomatous colon polyps.  Presents today for surveillance colonoscopy.  No complaints  REVIEW OF SYSTEMS:  All non-GI ROS negative. Past Medical History:  Diagnosis Date   Allergy    Anxiety    Anxiety 2015   Diabetes mellitus without complication ([place])    GERD (gastroesophageal reflux disease) 2015   Hyperlipidemia 2013   Hypertension 1990   Mitral valve prolapse 1955    Past Surgical History:  Procedure Laterality Date   CESAREAN SECTION  1984   CHOLECYSTECTOMY  1984   [place]  2003   Ruptured cervical disc  1992   twisted bowel  2001    Social History Stacy Parrish  reports that she quit smoking about 16 years ago. Her smoking use included cigarettes. She has never used smokeless tobacco. She reports current alcohol use. She reports that she does not use drugs.  family history is not on file.  Allergies  Allergen Reactions   Codeine Other (See Comments)    Hallucinations, rapid heart beat.   Latex Rash       PHYSICAL EXAMINATION: Vital signs: BP (!) 202/92   Pulse 79   Temp 98.2 F (36.8 C)   Ht '5\' 3"'$  (1.6 m)   Wt 150 lb 9.6 oz (68.3 kg)   SpO2 98%   BMI 26.68 kg/m  General: Well-developed, well-nourished, no acute distress HEENT: Sclerae are anicteric, conjunctiva pink. Oral mucosa intact Lungs: Clear Heart: Regular Abdomen: soft, nontender, nondistended, no obvious ascites, no peritoneal signs, normal bowel sounds. No organomegaly. Extremities: No edema Psychiatric: alert and oriented x3. Cooperative      ASSESSMENT:  Personal history of multiple adenomatous colon polyps   PLAN:  Surveillance colonoscopy

## 2022-08-17 NOTE — Op Note (Signed)
[facility] Patient Name: Kiara Mcdowell Procedure Date: 08/17/2022 10:40 AM MRN: [id number] Endoscopist: Docia Chuck. Henrene Pastor , MD Age: 66 Referring MD:  Date of Birth: Sep 15, 1956 Gender: Female Account #: [id number] Procedure:                Colonoscopy with cold snare polypectomy x 3 Indications:              High risk colon cancer surveillance: Personal                            history of multiple (3 or more) adenomas. Previous                            examination March 2018 Medicines:                Monitored Anesthesia Care Procedure:                Pre-Anesthesia Assessment:                           - Prior to the procedure, a History and Physical                            was performed, and patient medications and                            allergies were reviewed. The patient's tolerance of                            previous anesthesia was also reviewed. The risks                            and benefits of the procedure and the sedation                            options and risks were discussed with the patient.                            All questions were answered, and informed consent                            was obtained. Prior Anticoagulants: The patient has                            taken no previous anticoagulant or antiplatelet                            agents. ASA Grade Assessment: II - A patient with                            mild systemic disease. After reviewing the risks                            and benefits, the patient was deemed in  satisfactory condition to undergo the procedure.                           After obtaining informed consent, the colonoscope                            was passed under direct vision. Throughout the                            procedure, the patient's blood pressure, pulse, and                            oxygen saturations were monitored continuously. The                             Olympus CF-HQ190L ([id number]) Colonoscope was                            introduced through the anus and advanced to the the                            cecum, identified by appendiceal orifice and                            ileocecal valve. The ileocecal valve, appendiceal                            orifice, and rectum were photographed. The quality                            of the bowel preparation was excellent. The                            colonoscopy was performed without difficulty. The                            patient tolerated the procedure well. The bowel                            preparation used was SUPREP via split dose                            instruction. Scope In: 11:59:35 AM Scope Out: 12:19:51 PM Scope Withdrawal Time: 0 hours 12 minutes 38 seconds  Total Procedure Duration: 0 hours 20 minutes 16 seconds  Findings:                 Two polyps were found in the transverse colon and                            ascending colon. The polyps were 2 to 3 mm in size.                            These polyps were removed with a cold snare.  Resection and retrieval were complete.                           A 1 mm polyp was found in the transverse colon. The                            polyp was removed with a jumbo cold forceps.                            Resection and retrieval were complete.                           Multiple diverticula were found in the sigmoid                            colon. 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 This                            required changing from the standard adult scope to                            the standard pediatric colonoscope.                           The exam was otherwise without abnormality on                            direct and retroflexion views. Complications:            No immediate complications. Estimated blood loss:                            None. Estimated Blood Loss:     Estimated blood  loss: none. Impression:               - Two 2 to 3 mm polyps in the transverse colon and                            in the ascending colon, removed with a cold snare.                            Resected and retrieved.                           - One 1 mm polyp in the transverse colon, removed                            with a jumbo cold forceps. Resected and retrieved.                           - Diverticulosis in the sigmoid colon.                           - The examination was otherwise normal on direct  and retroflexion views. Recommendation:           - Repeat colonoscopy in 5 years for surveillance.                            PEDIATRIC SCOPE                           - Patient has a contact number available for                            emergencies. The signs and symptoms of potential                            delayed complications were discussed with the                            patient. Return to normal activities tomorrow.                            Written discharge instructions were provided to the                            patient.                           - Resume previous diet.                           - Continue present medications.                           - Await pathology results. Docia Chuck. Henrene Pastor, MD 08/17/2022 43:27:61 PM This report has been signed electronically.

## 2022-08-17 NOTE — Patient Instructions (Signed)
Handout on polyps and diverticulosis given to patient.  Await pathology results. Resume previous diet and continue present medications. Repeat colonoscopy for surveillance in 5 years. Pediatric scope needed next time.  YOU HAD AN ENDOSCOPIC PROCEDURE TODAY AT [facility]:   Refer to the procedure report that was given to you for any specific questions about what was found during the examination.  If the procedure report does not answer your questions, please call your gastroenterologist to clarify.  If you requested that your care partner not be given the details of your procedure findings, then the procedure report has been included in a sealed envelope for you to review at your convenience later.  YOU SHOULD EXPECT: Some feelings of bloating in the abdomen. Passage of more gas than usual.  Walking can help get rid of the air that was put into your GI tract during the procedure and reduce the bloating. If you had a lower endoscopy (such as a colonoscopy or flexible sigmoidoscopy) you may notice spotting of blood in your stool or on the toilet paper. If you underwent a bowel prep for your procedure, you may not have a normal bowel movement for a few days.  Please Note:  You might notice some irritation and congestion in your nose or some drainage.  This is from the oxygen used during your procedure.  There is no need for concern and it should clear up in a day or so.  SYMPTOMS TO REPORT IMMEDIATELY:  Following lower endoscopy (colonoscopy or flexible sigmoidoscopy):  Excessive amounts of blood in the stool  Significant tenderness or worsening of abdominal pains  Swelling of the abdomen that is new, acute  Fever of 100F or higher  For urgent or emergent issues, a gastroenterologist can be reached at any hour by calling [phone]. Do not use MyChart messaging for urgent concerns.    DIET:  We do recommend a small meal at first, but then you may proceed to your regular  diet.  Drink plenty of fluids but you should avoid alcoholic beverages for 24 hours.  ACTIVITY:  You should plan to take it easy for the rest of today and you should NOT DRIVE or use heavy machinery until tomorrow (because of the sedation medicines used during the test).    FOLLOW UP: Our staff will call the number listed on your records the next business day following your procedure.  We will call around 7:15- 8:00 am to check on you and address any questions or concerns that you may have regarding the information given to you following your procedure. If we do not reach you, we will leave a message.     If any biopsies were taken you will be contacted by phone or by letter within the next 1-3 weeks.  Please call us at [phone] if you have not heard about the biopsies in 3 weeks.    SIGNATURES/CONFIDENTIALITY: You and/or your care partner have signed paperwork which will be entered into your electronic medical record.  These signatures attest to the fact that that the information above on your After Visit Summary has been reviewed and is understood.  Full responsibility of the confidentiality of this discharge information lies with you and/or your care-partner.

## 2022-08-17 NOTE — Progress Notes (Signed)
Called to room to assist during endoscopic procedure.  Patient ID and intended procedure confirmed with present staff. Received instructions for my participation in the procedure from the performing physician.  

## 2022-08-17 NOTE — Progress Notes (Signed)
Report to PACU, RN, vss, BBS= Clear.  

## 2022-08-18 ENCOUNTER — Telehealth: Payer: Self-pay | Admitting: *Deleted

## 2022-08-18 NOTE — Telephone Encounter (Signed)
  Follow up Call-    Row Labels 08/17/2022   11:14 AM  Call back number   Section Header. No data exists in this row.   Post procedure Call Back phone  #   [phone]  Permission to leave phone message   Yes     Patient questions:  Do you have a fever, pain , or abdominal swelling? No. Pain Score  0 *  Have you tolerated food without any problems? Yes.    Have you been able to return to your normal activities? Yes.    Do you have any questions about your discharge instructions: Diet   No. Medications  No. Follow up visit  No.  Do you have questions or concerns about your Care? No.  Actions: * If pain score is 4 or above: No action needed, pain <4.

## 2022-08-26 ENCOUNTER — Encounter: Payer: Self-pay | Admitting: Internal Medicine

## 2023-10-05 ENCOUNTER — Other Ambulatory Visit: Payer: Self-pay | Admitting: Family Medicine

## 2023-10-05 DIAGNOSIS — Z1231 Encounter for screening mammogram for malignant neoplasm of breast: Secondary | ICD-10-CM

## 2023-11-18 ENCOUNTER — Other Ambulatory Visit: Payer: Self-pay | Admitting: Family Medicine

## 2023-11-18 ENCOUNTER — Ambulatory Visit
Admission: RE | Admit: 2023-11-18 | Discharge: 2023-11-18 | Disposition: A | Discharge status: Home / Self Care | Payer: Medicare HMO | Source: Ambulatory Visit | Attending: Family Medicine | Admitting: Family Medicine

## 2023-11-18 DIAGNOSIS — R053 Chronic cough: Secondary | ICD-10-CM

## 2023-11-18 DIAGNOSIS — Z1231 Encounter for screening mammogram for malignant neoplasm of breast: Secondary | ICD-10-CM
